# Patient Record
Sex: Male | Born: 1990 | Race: Black or African American | Hispanic: No | Marital: Single | Smoking: Never smoker
Health system: Southern US, Community
[De-identification: ages and names within clinical notes are randomized; demographics above are authoritative.]

## PROBLEM LIST (undated history)

## (undated) DIAGNOSIS — J45909 Unspecified asthma, uncomplicated: Secondary | ICD-10-CM

## (undated) HISTORY — PX: EXTERNAL EAR SURGERY: SHX627

---

## 2008-07-22 ENCOUNTER — Emergency Department (HOSPITAL_COMMUNITY): Admission: EM | Admit: 2008-07-22 | Discharge: 2008-07-23 | Payer: Self-pay | Admitting: Emergency Medicine

## 2009-04-08 ENCOUNTER — Emergency Department (HOSPITAL_COMMUNITY): Admission: EM | Admit: 2009-04-08 | Discharge: 2009-04-08 | Payer: Self-pay | Admitting: Emergency Medicine

## 2012-02-27 ENCOUNTER — Encounter (HOSPITAL_BASED_OUTPATIENT_CLINIC_OR_DEPARTMENT_OTHER): Payer: Self-pay | Admitting: *Deleted

## 2012-02-27 ENCOUNTER — Emergency Department (HOSPITAL_BASED_OUTPATIENT_CLINIC_OR_DEPARTMENT_OTHER)
Admission: EM | Admit: 2012-02-27 | Discharge: 2012-02-27 | Disposition: A | Discharge status: Home / Self Care | Payer: Self-pay | Attending: Emergency Medicine | Admitting: Emergency Medicine

## 2012-02-27 DIAGNOSIS — L02219 Cutaneous abscess of trunk, unspecified: Secondary | ICD-10-CM | POA: Insufficient documentation

## 2012-02-27 DIAGNOSIS — L0231 Cutaneous abscess of buttock: Secondary | ICD-10-CM | POA: Insufficient documentation

## 2012-02-27 DIAGNOSIS — L02419 Cutaneous abscess of limb, unspecified: Secondary | ICD-10-CM | POA: Insufficient documentation

## 2012-02-27 DIAGNOSIS — L0390 Cellulitis, unspecified: Secondary | ICD-10-CM

## 2012-02-27 DIAGNOSIS — L03119 Cellulitis of unspecified part of limb: Secondary | ICD-10-CM | POA: Insufficient documentation

## 2012-02-27 LAB — GLUCOSE, CAPILLARY: Glucose-Capillary: 98 mg/dL (ref 70–99)

## 2012-02-27 MED ORDER — HYDROCODONE-ACETAMINOPHEN 5-325 MG PO TABS
1.0000 | ORAL_TABLET | Freq: Once | ORAL | Status: AC
  Administered 2012-02-27: 1 via ORAL
  Filled 2012-02-27: qty 1

## 2012-02-27 MED ORDER — SULFAMETHOXAZOLE-TRIMETHOPRIM 800-160 MG PO TABS: 2.0000 | ORAL_TABLET | Freq: Two times a day (BID) | ORAL | Status: AC

## 2012-02-27 MED ORDER — LIDOCAINE HCL 2 % IJ SOLN
20.0000 mL | Freq: Once | INTRAMUSCULAR | Status: AC
  Administered 2012-02-27: 20 mg

## 2012-02-27 MED ORDER — HYDROCODONE-ACETAMINOPHEN 5-325 MG PO TABS: 1.0000 | ORAL_TABLET | ORAL | Status: AC | PRN

## 2012-02-27 MED ORDER — CEPHALEXIN 250 MG PO CAPS
500.0000 mg | ORAL_CAPSULE | Freq: Once | ORAL | Status: AC
  Administered 2012-02-27: 500 mg via ORAL
  Filled 2012-02-27: qty 2

## 2012-02-27 MED ORDER — SULFAMETHOXAZOLE-TMP DS 800-160 MG PO TABS
1.0000 | ORAL_TABLET | Freq: Once | ORAL | Status: AC
  Administered 2012-02-27: 1 via ORAL
  Filled 2012-02-27: qty 1

## 2012-02-27 MED ORDER — CEPHALEXIN 500 MG PO CAPS: 500.0000 mg | ORAL_CAPSULE | Freq: Four times a day (QID) | ORAL | Status: AC

## 2012-02-27 NOTE — ED Notes (Signed)
Complains of mild abscess on trunk started 2 days ago with subjective fever 24 hours ago treated by rubbing him down with alcohol. No other treatment prior to arrival patient with no past medical history no history of drug use on exam alert nontoxic lungs clear to auscultation heart regular rate and rhythm with no murmurs skin with several 0.5-1 cm abscess he is on trunk and buttocks  Doug Sou, MD 02/27/12 2209

## 2012-02-27 NOTE — Discharge Instructions (Signed)
Take antibiotics as prescribed.  Do the best you can not to miss any doses.  Return to the ER in 2 days for wound recheck.  Call [facility] [phone]) if you do not have a primary care doctor and would like assistance with finding one.   Abscess Care After An abscess (also called a boil or furuncle) is an infected area that contains a collection of pus. Signs and symptoms of an abscess include pain, tenderness, redness, or hardness, or you may feel a moveable soft area under your skin. An abscess can occur anywhere in the body. The infection may spread to surrounding tissues causing cellulitis. A cut (incision) by the surgeon was made over your abscess and the pus was drained out. Gauze may have been packed into the space to provide a drain that will allow the cavity to heal from the inside outwards. The boil may be painful for 5 to 7 days. Most people with a boil do not have high fevers. Your abscess, if seen early, may not have localized, and may not have been lanced. If not, another appointment may be required for this if it does not get better on its own or with medications. HOME CARE INSTRUCTIONS   Only take over-the-counter or prescription medicines for pain, discomfort, or fever as directed by your caregiver.   When you bathe, soak and then remove gauze or iodoform packs at least daily or as directed by your caregiver. You may then wash the wound gently with mild soapy water. Repack with gauze or do as your caregiver directs.  SEEK IMMEDIATE MEDICAL CARE IF:   You develop increased pain, swelling, redness, drainage, or bleeding in the wound site.   You develop signs of generalized infection including muscle aches, chills, fever, or a general ill feeling.   An oral temperature above 102 F (38.9 C) develops, not controlled by medication.  See your caregiver for a recheck if you develop any of the symptoms described above. If medications (antibiotics) were prescribed, take them as  directed. Document Released: 06/13/2005 Document Revised: 11/14/2011 Document Reviewed: 02/08/2008 [facility] Patient Information 2012 [place], [place].Cellulitis Cellulitis is an infection of the tissue under the skin. The infected area is usually red and tender. This is caused by germs. These germs enter the body through cuts or sores. This usually happens in the arms or lower legs. HOME CARE   Take your medicine as told. Finish it even if you start to feel better.   If the infection is on the arm or leg, keep it raised (elevated).   Use a warm cloth on the infected area several times a day.   See your doctor for a follow-up visit as told.  GET HELP RIGHT AWAY IF:   You are tired or confused.   You throw up (vomit).   You have watery poop (diarrhea).   You feel ill and have muscle aches.   You have a fever.  MAKE SURE YOU:   Understand these instructions.   Will watch your condition.   Will get help right away if you are not doing well or get worse.  Document Released: 05/13/2008 Document Revised: 11/14/2011 Document Reviewed: 10/27/2009 [facility] Patient Information 2012 [place], [place].

## 2012-02-27 NOTE — ED Notes (Signed)
Pt reports multiple boils since Tuesday on buttocks and stomach- states he had a "high fever" last night

## 2012-02-27 NOTE — ED Notes (Signed)
I and D cart to bedside.

## 2012-02-27 NOTE — ED Notes (Signed)
Red area to right upper leg with swelling and warm to touch. Pt states that he has been using a warm compress to area. All other boils are smaller in nature and no noted swelling at this time.

## 2012-02-27 NOTE — ED Provider Notes (Signed)
History     CSN: [id number]  Arrival date & time 02/27/12  6295   First MD Initiated Contact with Patient 02/27/12 2040      Chief Complaint  Patient presents with  . Recurrent Skin Infections  . Fever    (Consider location/radiation/quality/duration/timing/severity/associated sxs/prior treatment) HPI  History reviewed. No pertinent past medical history.  Past Surgical History  Procedure Date  . External ear surgery     No family history on file.  History  Substance Use Topics  . Smoking status: Never Smoker   . Smokeless tobacco: Never Used  . Alcohol Use: No      Review of Systems  Allergies  Review of patient's allergies indicates no known allergies.  Home Medications   Current Outpatient Rx  Name Route Sig Dispense Refill  . NAPROXEN SODIUM 220 MG PO TABS Oral Take 440 mg by mouth 2 (two) times daily with a meal. Patient used this medication for headache and fever.      BP 145/102  Pulse 107  Temp(Src) 98.4 F (36.9 C) (Oral)  Resp 20  Ht 6\' 1"  (1.854 m)  Wt 215 lb (97.523 kg)  BMI 28.37 kg/m2  SpO2 100%  Physical Exam  ED Course  Procedures (including critical care time)   Labs Reviewed  GLUCOSE, CAPILLARY   No results found.   No diagnosis found.    MDM  Duplicate note        Doug Sou, MD 02/28/12 [phone]

## 2012-02-27 NOTE — ED Provider Notes (Signed)
History     CSN: [id number]  Arrival date & time 02/27/12  2956   First MD Initiated Contact with Patient 02/27/12 2040      Chief Complaint  Patient presents with  . Recurrent Skin Infections  . Fever    (Consider location/radiation/quality/duration/timing/severity/associated sxs/prior treatment) HPI History provided by pt.   Pt presents w/ c/o 6 abscesses, all of which he noticed 2 days ago and have since increased in size and intensity of pain.  Pain aggravated by applying pressure.  On on abd, three on buttocks and one on each thigh.  A couple have drained purulent fluid.  Associated w/ subjective fever yesterday.  Pt has no prior h/o abscess nor any other medical problem.    History reviewed. No pertinent past medical history.  Past Surgical History  Procedure Date  . External ear surgery     No family history on file.  History  Substance Use Topics  . Smoking status: Never Smoker   . Smokeless tobacco: Never Used  . Alcohol Use: No      Review of Systems  All other systems reviewed and are negative.    Allergies  Review of patient's allergies indicates no known allergies.  Home Medications   Current Outpatient Rx  Name Route Sig Dispense Refill  . NAPROXEN SODIUM 220 MG PO TABS Oral Take 440 mg by mouth 2 (two) times daily with a meal. Patient used this medication for headache and fever.      BP 145/102  Pulse 107  Temp(Src) 98.4 F (36.9 C) (Oral)  Resp 20  Ht 6\' 1"  (1.854 m)  Wt 215 lb (97.523 kg)  BMI 28.37 kg/m2  SpO2 100%  Physical Exam  Nursing note and vitals reviewed. Constitutional: He is oriented to person, place, and time. He appears well-developed and well-nourished. No distress.  HENT:  Head: Normocephalic and atraumatic.  Eyes:       Normal appearance  Neck: Normal range of motion.  Cardiovascular: Normal rate and regular rhythm.   Pulmonary/Chest: Effort normal and breath sounds normal.  Neurological: He is alert and  oriented to person, place, and time.  Skin: Skin is warm and dry. No rash noted.       0.5cm abscess that appears to have drained w/ 1.5cm surrounding induration in LLQ.  0.5cm fluctuant abscess w/out surrounding cellulitis of left anterior thigh.  2 pimple-sized abscesses on left lateral buttock w/out surrounding cellulitis.  3-4cm area of induration and erythema of rightt buttock.  Visualized w/ bedside US and no drainable abscess. 0.5cm abscess w/ 4cm surrounding erythema, edema and induration of right proximal anterior thigh.    Psychiatric: He has a normal mood and affect. His behavior is normal.    ED Course  Procedures (including critical care time)  INCISION AND DRAINAGE Performed by: Otilio Miu Consent: Verbal consent obtained. Risks and benefits: risks, benefits and alternatives were discussed Type: abscess  Body area: right proximal anterior thigh  Anesthesia: local infiltration  Local anesthetic: lidocaine 2% w/out epinephrine  Anesthetic total: 4 ml  Complexity: complex Blunt dissection to break up loculations  Drainage: purulent  Drainage amount: moderate  Packing material: 1/4 in iodoform gauze  Patient tolerance: Patient tolerated the procedure well with no immediate complications.    Labs Reviewed - No data to display No results found.   1. Abscess and cellulitis       MDM  Healthy 20yo M presents w/ 6 abscesses x 2 days.  Abscess on right  proximal thigh w/ surrounding cellulitis.  I&D'd in ED.  Dr. Ethelda Chick recommended not sending wound culture.  There is also cellulitis of right buttock; no drainable abscess visible w/ bedside U/S.  Other four abscesses are very small and drainage not necessary at this time.  Recommended warm compresses.  Cbg 98.  Pt d/c'd home w/ bactrim/keflex and vicodin.  Recommended that he return in 2 days for recheck and packing removal.  Referred to [facility].         Otilio Miu, [place] 02/27/12  2329

## 2012-02-28 NOTE — ED Provider Notes (Signed)
Medical screening examination/treatment/procedure(s) were performed by non-physician practitioner and as supervising physician I was immediately available for consultation/collaboration.  Doug Sou, MD 02/28/12 [phone]

## 2012-02-29 ENCOUNTER — Emergency Department (HOSPITAL_BASED_OUTPATIENT_CLINIC_OR_DEPARTMENT_OTHER)
Admission: EM | Admit: 2012-02-29 | Discharge: 2012-02-29 | Disposition: A | Discharge status: Home / Self Care | Payer: Self-pay | Attending: Emergency Medicine | Admitting: Emergency Medicine

## 2012-02-29 ENCOUNTER — Encounter (HOSPITAL_BASED_OUTPATIENT_CLINIC_OR_DEPARTMENT_OTHER): Payer: Self-pay | Admitting: *Deleted

## 2012-02-29 DIAGNOSIS — L0291 Cutaneous abscess, unspecified: Secondary | ICD-10-CM

## 2012-02-29 DIAGNOSIS — L03119 Cellulitis of unspecified part of limb: Secondary | ICD-10-CM | POA: Insufficient documentation

## 2012-02-29 DIAGNOSIS — L02419 Cutaneous abscess of limb, unspecified: Secondary | ICD-10-CM | POA: Insufficient documentation

## 2012-02-29 NOTE — ED Notes (Signed)
Pt presents to ED today for thigh wound re-check.  Pt was seen and treated here on 3/21

## 2012-02-29 NOTE — Discharge Instructions (Signed)

## 2012-02-29 NOTE — ED Provider Notes (Signed)
History     CSN: [id number]  Arrival date & time 02/29/12  2022   First MD Initiated Contact with Patient 02/29/12 2044      Chief Complaint  Patient presents with  . Wound Check    (Consider location/radiation/quality/duration/timing/severity/associated sxs/prior treatment) HPI Comments: Pt states that he had an abscess drained on his right thigh and he came back to have it rechecked:pt states that he also has multiple other areas that were not treated but that are draining  Patient is a 21 y.o. male presenting with wound check. The history is provided by the patient. No language interpreter was used.  Wound Check  He was treated in the ED 2 to 3 days ago. Previous treatment in the ED includes oral antibiotics and I&D of abscess. Treatments since wound repair include oral antibiotics. His temperature was unmeasured prior to arrival. There has been colored discharge from the wound. The redness has improved. The swelling has improved. The pain has improved.    History reviewed. No pertinent past medical history.  Past Surgical History  Procedure Date  . External ear surgery     History reviewed. No pertinent family history.  History  Substance Use Topics  . Smoking status: Never Smoker   . Smokeless tobacco: Never Used  . Alcohol Use: No      Review of Systems  Constitutional: Negative.   Respiratory: Negative.   Cardiovascular: Negative.   Neurological: Negative.     Allergies  Review of patient's allergies indicates no known allergies.  Home Medications   Current Outpatient Rx  Name Route Sig Dispense Refill  . CEPHALEXIN 500 MG PO CAPS Oral Take 1 capsule (500 mg total) by mouth 4 (four) times daily. 84 capsule 0  . HYDROCODONE-ACETAMINOPHEN 5-325 MG PO TABS Oral Take 1 tablet by mouth every 4 (four) hours as needed for pain. 20 tablet 0  . NAPROXEN SODIUM 220 MG PO TABS Oral Take 440 mg by mouth 2 (two) times daily with a meal. Patient used this medication  for headache and fever.    . SULFAMETHOXAZOLE-TRIMETHOPRIM 800-160 MG PO TABS Oral Take 2 tablets by mouth 2 (two) times daily. 84 tablet 0    BP 140/80  Pulse 101  Temp(Src) 98.6 F (37 C) (Oral)  Resp 20  SpO2 100%  Physical Exam  Nursing note and vitals reviewed. Constitutional: He is oriented to person, place, and time. He appears well-developed and well-nourished.  Cardiovascular: Normal rate and regular rhythm.   Pulmonary/Chest: Effort normal and breath sounds normal.  Musculoskeletal: Normal range of motion.  Neurological: He is alert and oriented to person, place, and time.  Skin:       Pt has a wound to the left thigh that is draining with redness to the area:pt also has draining flat wounds to the left abdomen thigh and buttock    ED Course  Procedures (including critical care time)  Labs Reviewed - No data to display No results found.   1. Abscess       MDM  Drained wounds further:pt is to continued antibiotics at home:tetanus utd        Teressa Lower, NP 02/29/12 2113

## 2012-02-29 NOTE — ED Notes (Signed)
No new rx- d/c home with friend/family

## 2012-03-01 NOTE — ED Provider Notes (Signed)
Medical screening examination/treatment/procedure(s) were performed by non-physician practitioner and as supervising physician I was immediately available for consultation/collaboration.  Milt Coye T Alejandra Barna, MD 03/01/12 2331 

## 2014-04-27 ENCOUNTER — Emergency Department (HOSPITAL_BASED_OUTPATIENT_CLINIC_OR_DEPARTMENT_OTHER)
Admission: EM | Admit: 2014-04-27 | Discharge: 2014-04-27 | Disposition: A | Discharge status: Home / Self Care | Payer: Worker's Compensation | Attending: Emergency Medicine | Admitting: Emergency Medicine

## 2014-04-27 ENCOUNTER — Encounter (HOSPITAL_BASED_OUTPATIENT_CLINIC_OR_DEPARTMENT_OTHER): Payer: Self-pay | Admitting: Emergency Medicine

## 2014-04-27 DIAGNOSIS — Y9289 Other specified places as the place of occurrence of the external cause: Secondary | ICD-10-CM | POA: Insufficient documentation

## 2014-04-27 DIAGNOSIS — S61409A Unspecified open wound of unspecified hand, initial encounter: Secondary | ICD-10-CM | POA: Insufficient documentation

## 2014-04-27 DIAGNOSIS — Y9389 Activity, other specified: Secondary | ICD-10-CM | POA: Insufficient documentation

## 2014-04-27 DIAGNOSIS — Y990 Civilian activity done for income or pay: Secondary | ICD-10-CM | POA: Insufficient documentation

## 2014-04-27 DIAGNOSIS — S81009A Unspecified open wound, unspecified knee, initial encounter: Secondary | ICD-10-CM | POA: Insufficient documentation

## 2014-04-27 DIAGNOSIS — S91009A Unspecified open wound, unspecified ankle, initial encounter: Principal | ICD-10-CM

## 2014-04-27 DIAGNOSIS — S81011A Laceration without foreign body, right knee, initial encounter: Secondary | ICD-10-CM

## 2014-04-27 DIAGNOSIS — S81809A Unspecified open wound, unspecified lower leg, initial encounter: Principal | ICD-10-CM

## 2014-04-27 DIAGNOSIS — R296 Repeated falls: Secondary | ICD-10-CM | POA: Insufficient documentation

## 2014-04-27 NOTE — ED Notes (Signed)
Laceration to right knee at 5 am.  Bleeding minimal.

## 2014-04-27 NOTE — Discharge Instructions (Signed)
Local wound care with bacitracin and dressing changes twice daily.  Return to the emergency department if you develop pus draining from the wound, redness, or streaks up and down the leg.  Return or followup with your primary Dr. for suture removal in 10-14 days.   Laceration Care, Adult A laceration is a cut or lesion that goes through all layers of the skin and into the tissue just beneath the skin. TREATMENT  Some lacerations may not require closure. Some lacerations may not be able to be closed due to an increased risk of infection. It is important to see your caregiver as soon as possible after an injury to minimize the risk of infection and maximize the opportunity for successful closure. If closure is appropriate, pain medicines may be given, if needed. The wound will be cleaned to help prevent infection. Your caregiver will use stitches (sutures), staples, wound glue (adhesive), or skin adhesive strips to repair the laceration. These tools bring the skin edges together to allow for faster healing and a better cosmetic outcome. However, all wounds will heal with a scar. Once the wound has healed, scarring can be minimized by covering the wound with sunscreen during the day for 1 full year. HOME CARE INSTRUCTIONS  For sutures or staples:  Keep the wound clean and dry.  If you were given a bandage (dressing), you should change it at least once a day. Also, change the dressing if it becomes wet or dirty, or as directed by your caregiver.  Wash the wound with soap and water 2 times a day. Rinse the wound off with water to remove all soap. Pat the wound dry with a clean towel.  After cleaning, apply a thin layer of the antibiotic ointment as recommended by your caregiver. This will help prevent infection and keep the dressing from sticking.  You may shower as usual after the first 24 hours. Do not soak the wound in water until the sutures are removed.  Only take over-the-counter or  prescription medicines for pain, discomfort, or fever as directed by your caregiver.  Get your sutures or staples removed as directed by your caregiver. For skin adhesive strips:  Keep the wound clean and dry.  Do not get the skin adhesive strips wet. You may bathe carefully, using caution to keep the wound dry.  If the wound gets wet, pat it dry with a clean towel.  Skin adhesive strips will fall off on their own. You may trim the strips as the wound heals. Do not remove skin adhesive strips that are still stuck to the wound. They will fall off in time. For wound adhesive:  You may briefly wet your wound in the shower or bath. Do not soak or scrub the wound. Do not swim. Avoid periods of heavy perspiration until the skin adhesive has fallen off on its own. After showering or bathing, gently pat the wound dry with a clean towel.  Do not apply liquid medicine, cream medicine, or ointment medicine to your wound while the skin adhesive is in place. This may loosen the film before your wound is healed.  If a dressing is placed over the wound, be careful not to apply tape directly over the skin adhesive. This may cause the adhesive to be pulled off before the wound is healed.  Avoid prolonged exposure to sunlight or tanning lamps while the skin adhesive is in place. Exposure to ultraviolet light in the first year will darken the scar.  The skin adhesive  will usually remain in place for 5 to 10 days, then naturally fall off the skin. Do not pick at the adhesive film. You may need a tetanus shot if:  You cannot remember when you had your last tetanus shot.  You have never had a tetanus shot. If you get a tetanus shot, your arm may swell, get red, and feel warm to the touch. This is common and not a problem. If you need a tetanus shot and you choose not to have one, there is a rare chance of getting tetanus. Sickness from tetanus can be serious. SEEK MEDICAL CARE IF:   You have redness,  swelling, or increasing pain in the wound.  You see a red line that goes away from the wound.  You have yellowish-white fluid (pus) coming from the wound.  You have a fever.  You notice a bad smell coming from the wound or dressing.  Your wound breaks open before or after sutures have been removed.  You notice something coming out of the wound such as wood or glass.  Your wound is on your hand or foot and you cannot move a finger or toe. SEEK IMMEDIATE MEDICAL CARE IF:   Your pain is not controlled with prescribed medicine.  You have severe swelling around the wound causing pain and numbness or a change in color in your arm, hand, leg, or foot.  Your wound splits open and starts bleeding.  You have worsening numbness, weakness, or loss of function of any joint around or beyond the wound.  You develop painful lumps near the wound or on the skin anywhere on your body. MAKE SURE YOU:   Understand these instructions.  Will watch your condition.  Will get help right away if you are not doing well or get worse. Document Released: 11/25/2005 Document Revised: 02/17/2012 Document Reviewed: 05/21/2011 [facility] Patient Information 2014 [place]

## 2014-04-27 NOTE — ED Provider Notes (Signed)
CSN: [id number]     Arrival date & time 04/27/14  1020 History   First MD Initiated Contact with Patient 04/27/14 1033     Chief Complaint  Patient presents with  . Extremity Laceration     (Consider location/radiation/quality/duration/timing/severity/associated sxs/prior Treatment) HPI Comments: Patient presents after a fall that occurred at work. He has a laceration to his right knee and of all skin to his left hand. He is able ambulate without difficulty.  Patient is a 23 y.o. male presenting with skin laceration. The history is provided by the patient.  Laceration Location: Right knee. Depth:  Through dermis Quality: straight   Bleeding: controlled   Time since incident:  1 hour Laceration mechanism:  Fall Pain details:    Severity:  Mild   Timing:  Constant Foreign body present:  No foreign bodies Tetanus status:  Up to date   No past medical history on file. Past Surgical History  Procedure Laterality Date  . External ear surgery     No family history on file. History  Substance Use Topics  . Smoking status: Never Smoker   . Smokeless tobacco: Never Used  . Alcohol Use: No    Review of Systems  All other systems reviewed and are negative.     Allergies  Review of patient's allergies indicates no known allergies.  Home Medications   Prior to Admission medications   Not on File   BP 156/81  Pulse 94  Temp(Src) 98 F (36.7 C) (Oral)  Resp 18  Ht 6\' 1"  (1.854 m)  Wt 235 lb (106.595 kg)  BMI 31.01 kg/m2  SpO2 99% Physical Exam  Nursing note and vitals reviewed. Constitutional: He is oriented to person, place, and time. He appears well-developed and well-nourished. No distress.  HENT:  Head: Normocephalic and atraumatic.  Neck: Normal range of motion. Neck supple.  Musculoskeletal: Normal range of motion.  The right knee is noted to have a 2.5 cm laceration overlying the patella. There is no foreign body and bleeding is controlled.  Neurological:  He is alert and oriented to person, place, and time.  Skin: Skin is warm and dry. He is not diaphoretic.    ED Course  Procedures (including critical care time) Labs Review Labs Reviewed - No data to display  Imaging Review No results found.   EKG Interpretation None     LACERATION REPAIR Performed by: Geoffery Lyonsouglas Laiah Pouncey Authorized by: Geoffery Lyonsouglas Sanye Ledesma Consent: Verbal consent obtained. Risks and benefits: risks, benefits and alternatives were discussed Consent given by: patient Patient identity confirmed: provided demographic data Prepped and Draped in normal sterile fashion Wound explored  Laceration Location: Right knee  Laceration Length: 2.5 cm  No Foreign Bodies seen or palpated  Anesthesia: local infiltration  Local anesthetic: lidocaine 2 % without epinephrine  Anesthetic total: 3 ml  Irrigation method: syringe Amount of cleaning: standard  Skin closure: 4-0 Prolene   Number of sutures: 5   Technique: Simple interrupted   Patient tolerance: Patient tolerated the procedure well with no immediate complications.   MDM   Final diagnoses:  None    Local wound care, sutures out in 10-14 days.Geoffery Lyons.    Amiria Orrison, MD 04/27/14 1052

## 2018-11-30 ENCOUNTER — Other Ambulatory Visit: Payer: Self-pay | Admitting: Podiatry

## 2018-11-30 ENCOUNTER — Ambulatory Visit: Payer: BC Managed Care – PPO | Admitting: Podiatry

## 2018-11-30 ENCOUNTER — Ambulatory Visit (INDEPENDENT_AMBULATORY_CARE_PROVIDER_SITE_OTHER): Payer: BC Managed Care – PPO

## 2018-11-30 ENCOUNTER — Encounter: Payer: Self-pay | Admitting: Podiatry

## 2018-11-30 VITALS — BP 143/87 | HR 67

## 2018-11-30 DIAGNOSIS — M659 Synovitis and tenosynovitis, unspecified: Secondary | ICD-10-CM

## 2018-11-30 DIAGNOSIS — Q6652 Congenital pes planus, left foot: Secondary | ICD-10-CM | POA: Diagnosis not present

## 2018-11-30 DIAGNOSIS — M65972 Unspecified synovitis and tenosynovitis, left ankle and foot: Secondary | ICD-10-CM

## 2018-11-30 DIAGNOSIS — M25572 Pain in left ankle and joints of left foot: Secondary | ICD-10-CM

## 2018-12-05 NOTE — Progress Notes (Signed)
   Subjective:  27 year old male presenting today as a new patient with a chief complaint of intermittent sharp pain of the left lateral ankle that began 1-2 years ago. He states the pain is worse with walking but is also present when laying down at times. He has not done anything for treatment. Patient is here for further evaluation and treatment.   History reviewed. No pertinent past medical history.     Objective/Physical Exam General: The patient is alert and oriented x3 in no acute distress.  Dermatology: Skin is warm, dry and supple bilateral lower extremities. Negative for open lesions or macerations.  Vascular: Palpable pedal pulses bilaterally. No edema or erythema noted. Capillary refill within normal limits.  Neurological: Epicritic and protective threshold grossly intact bilaterally.   Musculoskeletal Exam: Range of motion within normal limits to all pedal and ankle joints bilateral. Muscle strength 5/5 in all groups bilateral.  Upon weightbearing there is a medial longitudinal arch collapse bilaterally. Remove foot valgus noted to the bilateral lower extremities with excessive pronation upon mid stance. Pain with palpation noted to the medial, lateral and anterior aspects of the left ankle joint.   Radiographic Exam:  Normal osseous mineralization. Joint spaces preserved. No fracture/dislocation/boney destruction.   Pes planus noted on radiographic exam lateral views. Decreased calcaneal inclination and metatarsal declination angle is noted. Anterior break in the cyma line noted on lateral views. Medial talar head to deviation noted on AP radiograph.   Assessment: 1. pes planus bilateral 2. Ankle synovitis left    Plan of Care:  1. Patient was evaluated. X-Rays reviewed.  2. Injection of 0.5 mLs Celestone Soluspan injected into the left ankle joint.  3. Appointment with Raiford Nobleick, Pedorthist, for custom molded orthotics.  4. Ankle brace dispensed.  5. Recommended good shoe  gear.  6. Return to clinic in 8 weeks.   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    Felecia ShellingBrent M. Lisaanne Lawrie, DPM [facility]  Dr. Felecia ShellingBrent M. Laith Antonelli, DPM    [address]                                        [place], [place] [address]                Office [phone]  Fax [phone]

## 2018-12-15 ENCOUNTER — Other Ambulatory Visit: Payer: BC Managed Care – PPO | Admitting: Orthotics

## 2018-12-23 ENCOUNTER — Other Ambulatory Visit: Payer: BC Managed Care – PPO | Admitting: Orthotics

## 2018-12-23 DIAGNOSIS — Q6652 Congenital pes planus, left foot: Secondary | ICD-10-CM | POA: Diagnosis not present

## 2018-12-23 DIAGNOSIS — M65071 Abscess of tendon sheath, right ankle and foot: Secondary | ICD-10-CM | POA: Diagnosis not present

## 2019-01-14 ENCOUNTER — Other Ambulatory Visit: Payer: BC Managed Care – PPO | Admitting: Orthotics

## 2019-01-25 ENCOUNTER — Ambulatory Visit: Payer: BC Managed Care – PPO | Admitting: Podiatry

## 2019-01-25 ENCOUNTER — Ambulatory Visit: Payer: BC Managed Care – PPO | Admitting: Orthotics

## 2019-01-25 DIAGNOSIS — Q6652 Congenital pes planus, left foot: Secondary | ICD-10-CM

## 2019-01-25 DIAGNOSIS — M659 Synovitis and tenosynovitis, unspecified: Secondary | ICD-10-CM

## 2019-01-25 NOTE — Progress Notes (Signed)
Patient came in today to pick up custom made foot orthotics.  The goals were accomplished and the patient reported no dissatisfaction with said orthotics.  Patient was advised of breakin period and how to report any issues. 

## 2019-01-27 ENCOUNTER — Encounter: Payer: BC Managed Care – PPO | Admitting: Podiatry

## 2019-02-03 NOTE — Progress Notes (Signed)
This encounter was created in error - please disregard.

## 2021-09-02 ENCOUNTER — Encounter (HOSPITAL_BASED_OUTPATIENT_CLINIC_OR_DEPARTMENT_OTHER): Payer: Self-pay | Admitting: *Deleted

## 2021-09-02 ENCOUNTER — Emergency Department (HOSPITAL_BASED_OUTPATIENT_CLINIC_OR_DEPARTMENT_OTHER): Payer: Commercial Managed Care - PPO

## 2021-09-02 ENCOUNTER — Emergency Department (HOSPITAL_BASED_OUTPATIENT_CLINIC_OR_DEPARTMENT_OTHER)
Admission: EM | Admit: 2021-09-02 | Discharge: 2021-09-02 | Disposition: A | Discharge status: Home / Self Care | Payer: Commercial Managed Care - PPO | Attending: Emergency Medicine | Admitting: Emergency Medicine

## 2021-09-02 ENCOUNTER — Other Ambulatory Visit: Payer: Self-pay

## 2021-09-02 DIAGNOSIS — J45909 Unspecified asthma, uncomplicated: Secondary | ICD-10-CM | POA: Diagnosis not present

## 2021-09-02 DIAGNOSIS — R2242 Localized swelling, mass and lump, left lower limb: Secondary | ICD-10-CM | POA: Diagnosis not present

## 2021-09-02 DIAGNOSIS — M25472 Effusion, left ankle: Secondary | ICD-10-CM

## 2021-09-02 HISTORY — DX: Unspecified asthma, uncomplicated: J45.909

## 2021-09-02 NOTE — ED Triage Notes (Signed)
Pt c/o left ankle swelling x 2 weeks. Denies injury. Ambulated to triage with steady gait

## 2021-09-02 NOTE — Discharge Instructions (Signed)
Your x-ray did not show any fracture.  Please stay off your leg for 2 days and keep the leg elevated.  You may apply ice as well for swelling  See your doctor for follow-up  Return to ER if you have worse ankle swelling, calf pain, leg pain

## 2021-09-02 NOTE — ED Provider Notes (Signed)
[facility] EMERGENCY DEPARTMENT Provider Note   CSN: [id number] Arrival date & time: 09/02/21  1455     History Chief Complaint  Patient presents with  . Joint Swelling    Left ankle    Tracy Schmidt is a 30 y.o. male here presenting with left ankle swelling.  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  He noticed left ankle swelling for the last several days.  Denies any falls or trauma.  He states that he is able to bear weight on the leg.  Denies any calf pain.  The history is provided by the patient.      Past Medical History:  Diagnosis Date  . Asthma     There are no problems to display for this patient.   Past Surgical History:  Procedure Laterality Date  . EXTERNAL EAR SURGERY         No family history on file.  Social History   Tobacco Use  . Smoking status: Never  . Smokeless tobacco: Never  Vaping Use  . Vaping Use: Never used  Substance Use Topics  . Alcohol use: No  . Drug use: No    Home Medications Prior to Admission medications   Not on File    Allergies    Patient has no known allergies.  Review of Systems   Review of Systems  Musculoskeletal:        Left ankle swelling  All other systems reviewed and are negative.  Physical Exam Updated Vital Signs BP (!) 137/96 (BP Location: Right Arm)   Pulse (!) 58   Temp 98.6 F (37 C) (Oral)   Resp 16   Ht 6' (1.829 m)   Wt 108.9 kg   SpO2 100%   BMI 32.55 kg/m   Physical Exam Vitals and nursing note reviewed.  HENT:     Head: Normocephalic.     Nose: Nose normal.     Mouth/Throat:     Mouth: Mucous membranes are moist.  Eyes:     Pupils: Pupils are equal, round, and reactive to light.  Cardiovascular:     Rate and Rhythm: Normal rate.     Pulses: Normal pulses.  Pulmonary:     Effort: Pulmonary effort is normal.  Abdominal:     General: Abdomen is flat.  Musculoskeletal:     Cervical back: Normal range of motion.      Comments: Mild left ankle swelling and left foot swelling.  2+ pulses.  No calf tenderness or swelling or pain.  Skin:    Capillary Refill: Capillary refill takes less than 2 seconds.  Neurological:     General: No focal deficit present.     Mental Status: He is alert and oriented to person, place, and time.  Psychiatric:        Mood and Affect: Mood normal.        Behavior: Behavior normal.    ED Results / Procedures / Treatments   Labs (all labs ordered are listed, but only abnormal results are displayed) Labs Reviewed - No data to display  EKG None  Radiology DG Ankle Complete Left  Result Date: 09/02/2021 CLINICAL DATA:  pain and swelling x 1 week EXAM: LEFT ANKLE COMPLETE - 3 view COMPARISON:  X-ray left ankle 11/30/2018 FINDINGS: No evidence of fracture, dislocation, or joint effusion. No evidence of severe arthropathy. No aggressive appearing focal bone abnormality. Soft tissues are grossly unremarkable. IMPRESSION:  No acute displaced fracture or dislocation. Electronically Signed   By: Tish Frederickson M.D.   On: 09/02/2021 15:52    Procedures Procedures   Medications Ordered in ED Medications - No data to display  ED Course  I have reviewed the triage vital signs and the nursing notes.  Pertinent labs & imaging results that were available during my care of the patient were reviewed by me and considered in my medical decision making (see chart for details).    MDM Rules/Calculators/A&P                          Tracy Schmidt is a 30 y.o. male here presenting with left ankle swelling.  X-ray did not show any fracture.  No signs of DVT.  He is on his feet a lot I think likely mild dependent edema.  Told him to keep leg elevated and put ice.  Stable for discharge   Final Clinical Impression(s) / ED Diagnoses Final diagnoses:  None    Rx / DC Orders ED Discharge Orders     None        Charlynne Pander, MD 09/02/21 1751

## 2022-02-14 IMAGING — CR DG ANKLE COMPLETE 3+V*L*
3 series · 3 of 3 positions shown · non-contrast
Comparison: X-ray left ankle 11/30/2018

CLINICAL DATA: pain and swelling x 1 week

EXAM:
LEFT ANKLE COMPLETE - 3 view

[t ankle joint ap left]
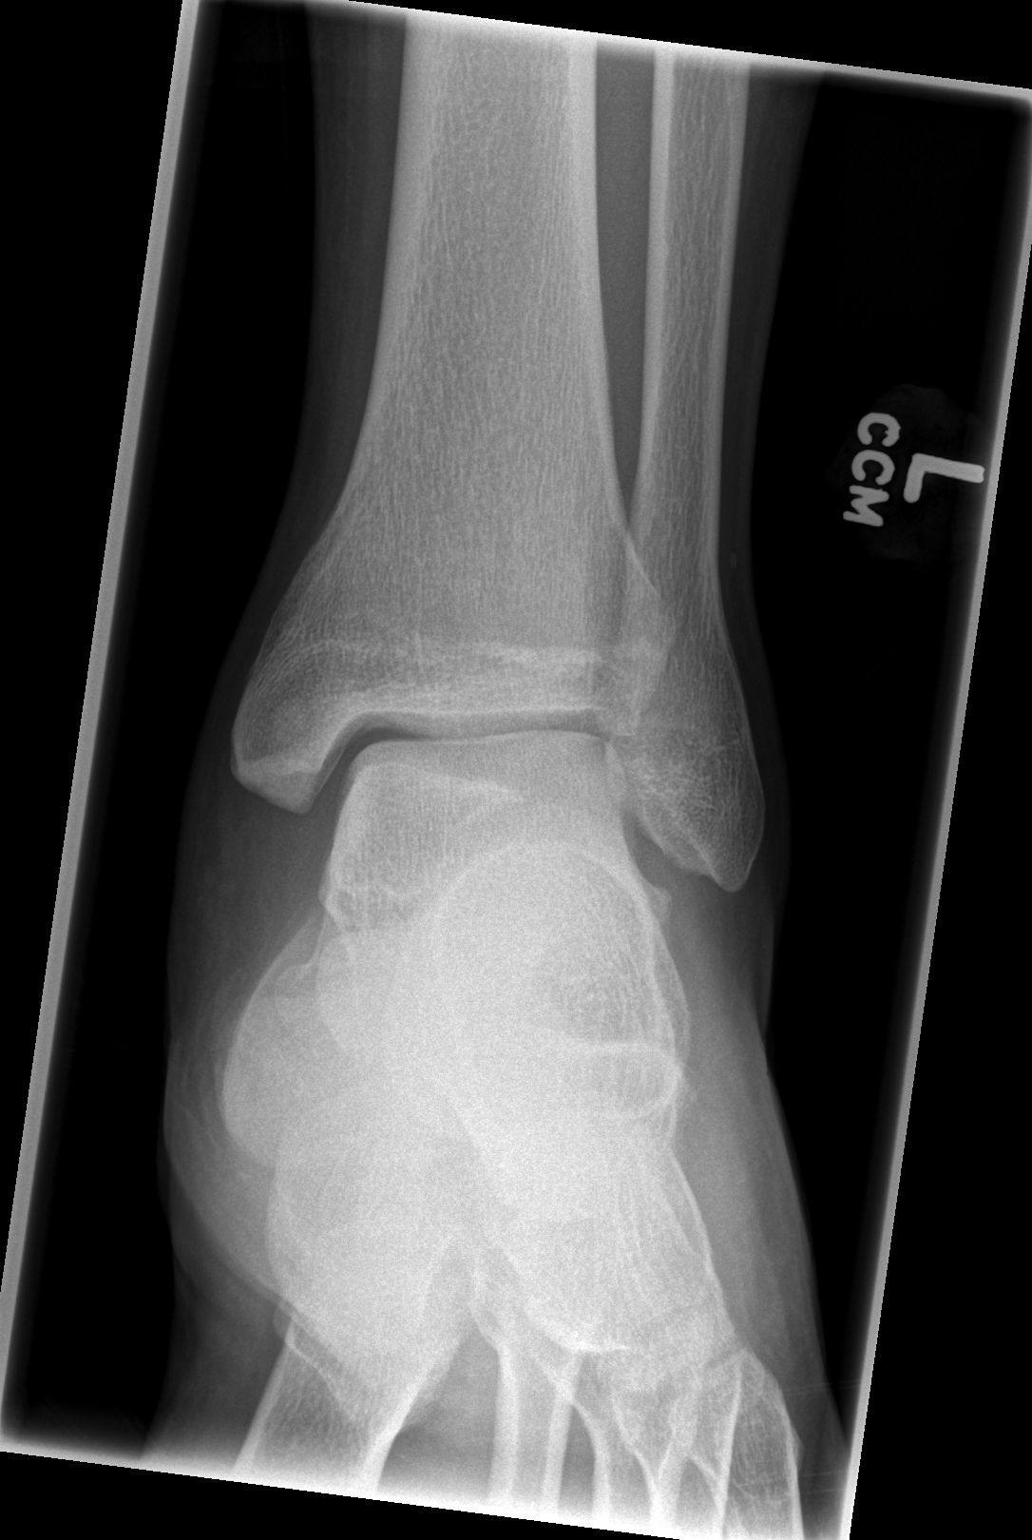

[t ankle joint oblique left]
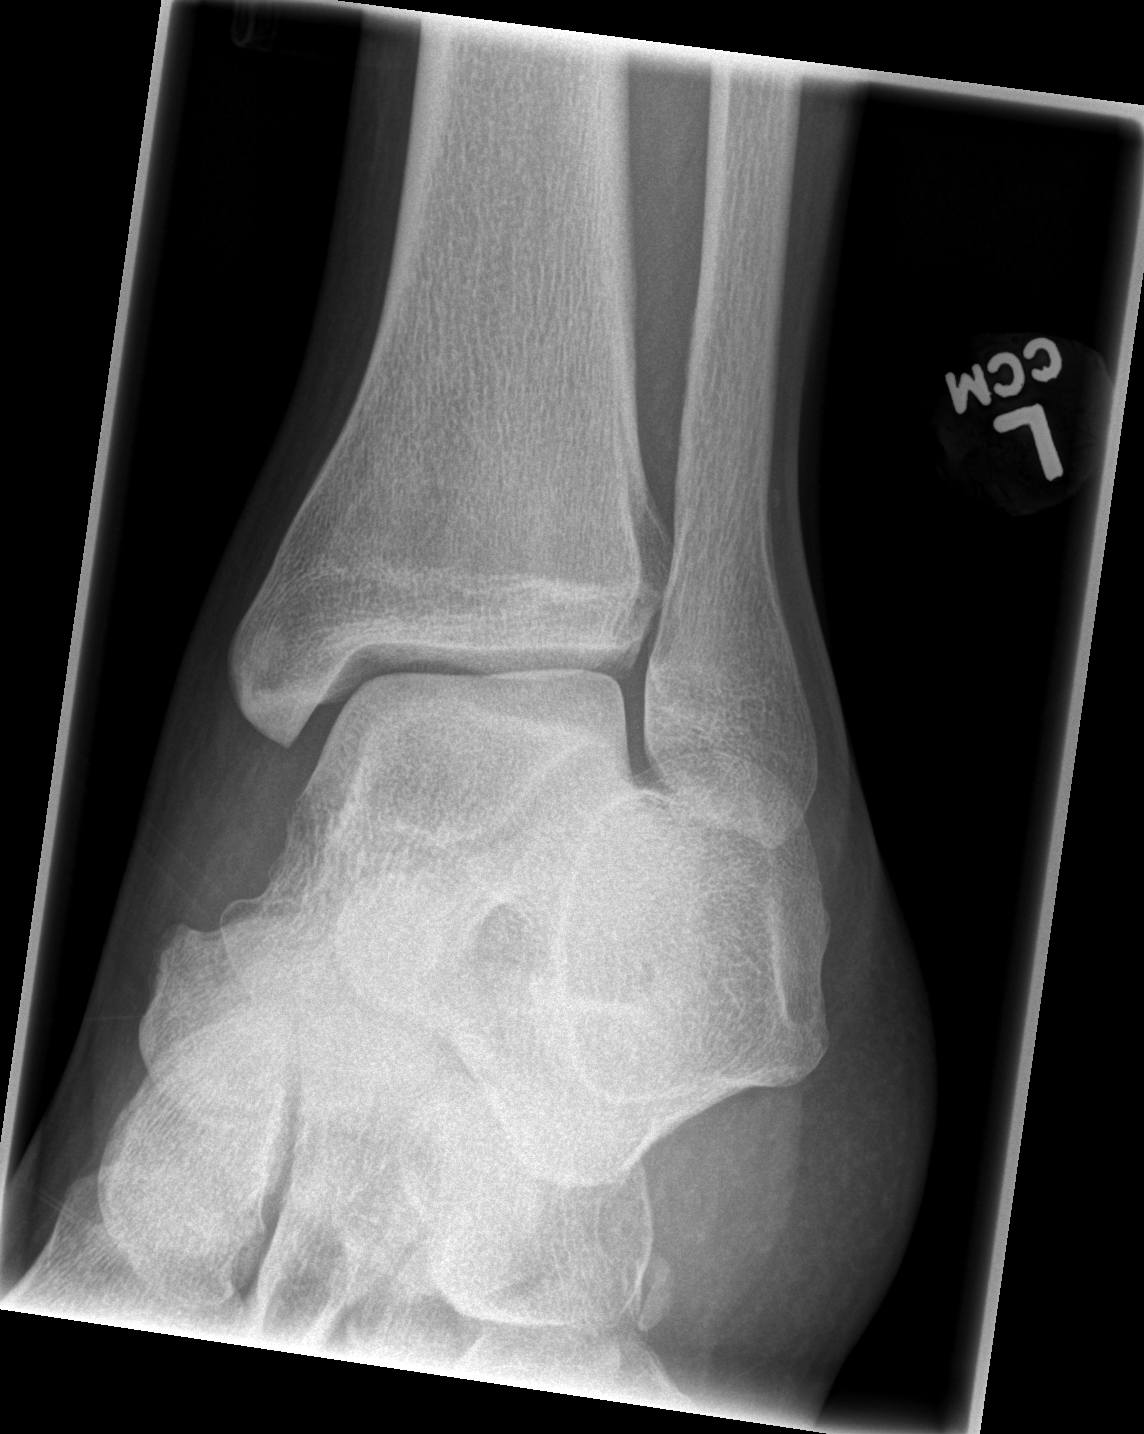

[t ankle joint lat left]
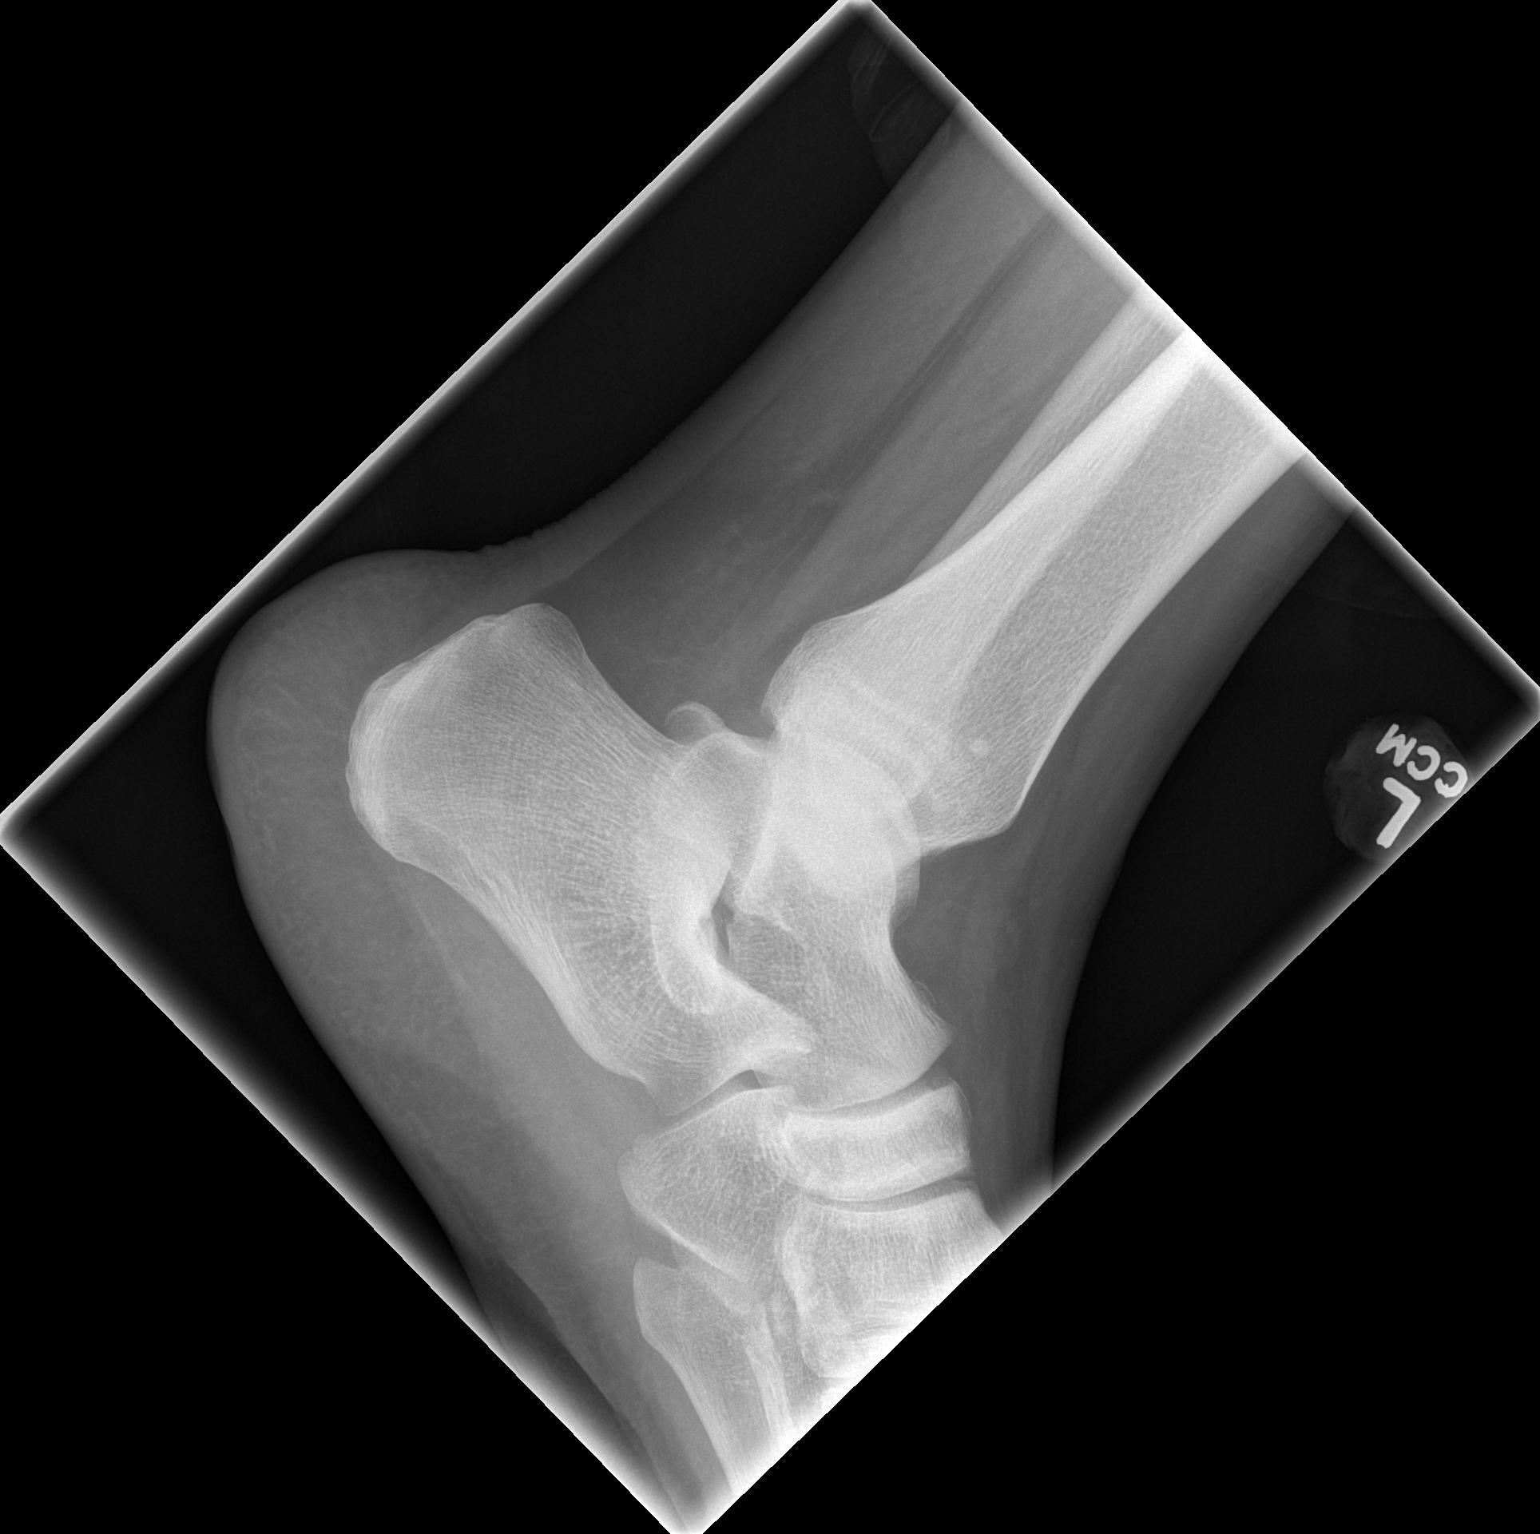

[3 of 3 positions shown; findings below may reference images not displayed]

FINDINGS: No evidence of fracture, dislocation, or joint effusion. No evidence
of severe arthropathy. No aggressive appearing focal bone
abnormality. Soft tissues are grossly unremarkable.
IMPRESSION: No acute displaced fracture or dislocation.
